# Patient Record
Sex: Female | Born: 1995 | Hispanic: No | Marital: Single | Smoking: Current every day smoker
Health system: Southern US, Community
[De-identification: ages and names within clinical notes are randomized; demographics above are authoritative.]

---

## 2018-06-08 ENCOUNTER — Emergency Department (HOSPITAL_COMMUNITY)
Admission: EM | Admit: 2018-06-08 | Discharge: 2018-06-08 | Disposition: A | Discharge status: Home / Self Care | Payer: Medicaid Other | Attending: Emergency Medicine | Admitting: Emergency Medicine

## 2018-06-08 ENCOUNTER — Other Ambulatory Visit: Payer: Self-pay

## 2018-06-08 ENCOUNTER — Emergency Department (HOSPITAL_COMMUNITY): Payer: Medicaid Other

## 2018-06-08 ENCOUNTER — Encounter (HOSPITAL_COMMUNITY): Payer: Self-pay | Admitting: Emergency Medicine

## 2018-06-08 DIAGNOSIS — R69 Illness, unspecified: Secondary | ICD-10-CM

## 2018-06-08 DIAGNOSIS — R1110 Vomiting, unspecified: Secondary | ICD-10-CM | POA: Diagnosis not present

## 2018-06-08 DIAGNOSIS — J111 Influenza due to unidentified influenza virus with other respiratory manifestations: Secondary | ICD-10-CM | POA: Diagnosis not present

## 2018-06-08 DIAGNOSIS — R52 Pain, unspecified: Secondary | ICD-10-CM | POA: Diagnosis present

## 2018-06-08 DIAGNOSIS — R059 Cough, unspecified: Secondary | ICD-10-CM

## 2018-06-08 DIAGNOSIS — R05 Cough: Secondary | ICD-10-CM

## 2018-06-08 MED ORDER — OSELTAMIVIR PHOSPHATE 75 MG PO CAPS: 75.0000 mg | ORAL_CAPSULE | Freq: Two times a day (BID) | ORAL | 0 refills | Status: AC

## 2018-06-08 MED ORDER — ONDANSETRON 4 MG PO TBDP
4.0000 mg | ORAL_TABLET | Freq: Once | ORAL | Status: AC
  Administered 2018-06-08: 4 mg via ORAL
  Filled 2018-06-08: qty 1

## 2018-06-08 MED ORDER — ALBUTEROL SULFATE HFA 108 (90 BASE) MCG/ACT IN AERS
2.0000 | INHALATION_SPRAY | RESPIRATORY_TRACT | Status: DC | PRN
  Administered 2018-06-08: 2 via RESPIRATORY_TRACT
  Filled 2018-06-08: qty 6.7

## 2018-06-08 MED ORDER — GUAIFENESIN 100 MG/5ML PO SYRP: 100.0000 mg | ORAL_SOLUTION | Freq: Three times a day (TID) | ORAL | 0 refills | Status: AC | PRN

## 2018-06-08 MED ORDER — AEROCHAMBER PLUS FLO-VU LARGE MISC
1.0000 | Freq: Once | Status: AC
  Administered 2018-06-08: 1

## 2018-06-08 MED ORDER — ONDANSETRON 4 MG PO TBDP: ORAL_TABLET | ORAL | 0 refills | Status: AC

## 2018-06-08 NOTE — ED Triage Notes (Signed)
C/o productive cough with white sputum, generalized body aches, pain to center of chest, and vomiting x 2 days.

## 2018-06-08 NOTE — ED Notes (Signed)
Pt seen walking out of ED entrance.  Didn't tell anyone she was leaving.

## 2018-06-08 NOTE — ED Provider Notes (Signed)
[facility] EMERGENCY DEPARTMENT Provider Note   CSN: [id number] Arrival date & time: 06/08/18  0005     History   Chief Complaint Chief Complaint  Patient presents with  . flu like symptoms  . Chest Pain  . Generalized Body Aches  . Cough    HPI Fusako Tanabe is a 23 y.o. female with a hx of no major medical problems presents to the Emergency Department complaining of gradual, persistent, progressively worsening URI symptoms onset 2 days ago after coming with a friend to the emergency department.  Patient reports associated rhinorrhea, nasal congestion, postnasal drip, sore throat, cough, mild headache, subjective fevers.  Patient reports some chest pain only when she coughs.  Patient reports intermittent posttussive emesis but able to eat and drink without difficulty in between.  She denies abdominal pain, diarrhea, weakness, dizziness, syncope.  No treatments prior to arrival.  Patient denies neck stiffness, chest pain, shortness of breath, wheezing.  Patient denies possibility of pregnancy.   The history is provided by the patient and medical records. No language interpreter was used.    History reviewed. No pertinent past medical history.  There are no active problems to display for this patient.   History reviewed. No pertinent surgical history.   OB History   No obstetric history on file.      Home Medications    Prior to Admission medications   Medication Sig Start Date End Date Taking? Authorizing Provider  guaifenesin (ROBITUSSIN) 100 MG/5ML syrup Take 5-10 mLs (100-200 mg total) by mouth 3 (three) times daily as needed for cough. 06/08/18   Denicia Pagliarulo, Dahlia Client, PA-C  ondansetron (ZOFRAN ODT) 4 MG disintegrating tablet 4mg  ODT q4 hours prn nausea/vomit 06/08/18   Achaia Garlock, Dahlia Client, PA-C  oseltamivir (TAMIFLU) 75 MG capsule Take 1 capsule (75 mg total) by mouth every 12 (twelve) hours. 06/08/18   Geraldene Eisel, Dahlia Client, PA-C     Family History No family history on file.  Social History Social History   Tobacco Use  . Smoking status: Current Every Day Smoker  . Smokeless tobacco: Never Used  Substance Use Topics  . Alcohol use: Yes  . Drug use: Not Currently     Allergies   Patient has no allergy information on record.   Review of Systems Review of Systems  Constitutional: Positive for fatigue and fever ( Subjective). Negative for appetite change and chills.  HENT: Positive for congestion, postnasal drip, rhinorrhea, sinus pressure and sore throat. Negative for ear discharge, ear pain and mouth sores.   Eyes: Negative for visual disturbance.  Respiratory: Positive for cough, chest tightness, shortness of breath and wheezing. Negative for stridor.   Cardiovascular: Positive for chest pain. Negative for palpitations and leg swelling.  Gastrointestinal: Positive for vomiting ( Posttussive). Negative for abdominal pain, diarrhea and nausea.  Genitourinary: Negative for dysuria, frequency, hematuria and urgency.  Musculoskeletal: Negative for arthralgias, back pain, myalgias and neck stiffness.  Skin: Negative for rash.  Neurological: Positive for headaches. Negative for syncope, light-headedness and numbness.  Hematological: Negative for adenopathy.  Psychiatric/Behavioral: The patient is not nervous/anxious.   All other systems reviewed and are negative.    Physical Exam Updated Vital Signs BP 105/64   Pulse 84   Temp 98.5 F (36.9 C) (Oral)   Resp 16   Ht 5\' 4"  (1.626 m)   Wt 56.7 kg   LMP 06/04/2018   SpO2 99%   BMI 21.46 kg/m   Physical Exam Vitals signs and nursing note reviewed.  Constitutional:      General: She is not in acute distress.    Appearance: She is well-developed. She is not diaphoretic.  HENT:     Head: Normocephalic and atraumatic.     Right Ear: Tympanic membrane, ear canal and external ear normal.     Left Ear: Tympanic membrane, ear canal and external ear  normal.     Nose: Mucosal edema and rhinorrhea present.     Right Sinus: No maxillary sinus tenderness or frontal sinus tenderness.     Left Sinus: No maxillary sinus tenderness or frontal sinus tenderness.     Mouth/Throat:     Mouth: Mucous membranes are not pale and not cyanotic.     Pharynx: Uvula midline. No oropharyngeal exudate or posterior oropharyngeal erythema.     Tonsils: No tonsillar abscesses.  Eyes:     Conjunctiva/sclera: Conjunctivae normal.     Pupils: Pupils are equal, round, and reactive to light.  Neck:     Musculoskeletal: Full passive range of motion without pain and normal range of motion.  Cardiovascular:     Rate and Rhythm: Normal rate.  Pulmonary:     Effort: Pulmonary effort is normal.     Breath sounds: Normal breath sounds. No stridor.  Abdominal:     Palpations: Abdomen is soft.     Tenderness: There is no abdominal tenderness.     Comments: Soft and nontender  Musculoskeletal: Normal range of motion.  Lymphadenopathy:     Cervical: No cervical adenopathy.  Skin:    General: Skin is warm and dry.     Findings: No rash.  Neurological:     Mental Status: She is alert.      ED Treatments / Results  Labs (all labs ordered are listed, but only abnormal results are displayed) Labs Reviewed  POC URINE PREG, ED    EKG Interpretation  Date/Time:  Monday June 08 2018 00:22:20 EST Ventricular Rate:  89 PR Interval:  130 QRS Duration: 72 QT Interval:  352 QTC Calculation: 428 R Axis:   89 Text Interpretation:  Normal sinus rhythm Normal ECG No old tracing to compare Confirmed by Drema Pry [phone]) on 06/08/2018 5:11:19 AM        Radiology Dg Chest 2 View  Result Date: 06/08/2018 CLINICAL DATA:  23 y/o F; productive cough with white sputum, generalized body aches, pain to the center of the chest, and vomiting for 2 days. EXAM: CHEST - 2 VIEW COMPARISON:  None. FINDINGS: The heart size and mediastinal contours are within normal  limits. Both lungs are clear. The visualized skeletal structures are unremarkable. IMPRESSION: No active cardiopulmonary disease. Electronically Signed   By: Mitzi Hansen M.D.   On: 06/08/2018 02:56    Procedures Procedures (including critical care time)  Medications Ordered in ED Medications  albuterol (PROVENTIL HFA;VENTOLIN HFA) 108 (90 Base) MCG/ACT inhaler 2 puff (2 puffs Inhalation Given 06/08/18 0510)  ondansetron (ZOFRAN-ODT) disintegrating tablet 4 mg (4 mg Oral Given 06/08/18 0509)  AEROCHAMBER PLUS FLO-VU LARGE MISC 1 each (1 each Other Given 06/08/18 2409)     Initial Impression / Assessment and Plan / ED Course  I have reviewed the triage vital signs and the nursing notes.  Pertinent labs & imaging results that were available during my care of the patient were reviewed by me and considered in my medical decision making (see chart for details).  Clinical Course as of Jun 09 555  Mon Jun 08, 2018  7353 Afebrile without tachycardia,  fever or hypotension.  Temp: 98.5 F (36.9 C) [HM]    Clinical Course User Index [HM] Azeem Poorman, Dahlia ClientHannah, PA-C    Patient with symptoms consistent with influenza.  Vitals are stable, low-grade fever.  No signs of dehydration, tolerating PO's.  Lungs are clear.  Chest x-ray without evidence of pneumonia, pneumothorax or pulmonary edema.  Discussed the cost versus benefit of Tamiflu treatment with the patient.  Patient will be discharged with instructions to orally hydrate, rest, and use over-the-counter medications such as anti-inflammatories ibuprofen and Aleve for muscle aches and Tylenol for fever.  Patient will also be given a cough suppressant.    Final Clinical Impressions(s) / ED Diagnoses   Final diagnoses:  Influenza-like illness  Cough  Post-tussive emesis    ED Discharge Orders         Ordered    oseltamivir (TAMIFLU) 75 MG capsule  Every 12 hours     06/08/18 0457    guaifenesin (ROBITUSSIN) 100 MG/5ML syrup  3  times daily PRN     06/08/18 0457    ondansetron (ZOFRAN ODT) 4 MG disintegrating tablet     06/08/18 0458           Micala Saltsman, Dahlia ClientHannah, PA-C 06/08/18 0557    Nira Connardama, Pedro Eduardo, MD 06/08/18 [phone]

## 2018-06-08 NOTE — Discharge Instructions (Signed)
1. Medications: Tamiflu, Robitussin, Zofran, alternate tylenol and ibuprofen for fever control, usual home medications 2. Treatment: rest, drink plenty of fluids,  3. Follow Up: Please followup with your primary doctor in 2 days for discussion of your diagnoses and further evaluation after today's visit; if you do not have a primary care doctor use the resource guide provided to find one; Please return to the ER for syncope, difficulty breathing, persistent high fevers, intractable vomiting or other concerns

## 2018-06-08 NOTE — ED Notes (Signed)
Pt returned to waiting room

## 2019-07-08 ENCOUNTER — Other Ambulatory Visit: Payer: Self-pay

## 2019-07-08 ENCOUNTER — Ambulatory Visit (HOSPITAL_COMMUNITY)
Admission: EM | Admit: 2019-07-08 | Discharge: 2019-07-08 | Disposition: A | Discharge status: Home / Self Care | Payer: Medicaid Other | Attending: Family Medicine | Admitting: Family Medicine

## 2019-07-08 ENCOUNTER — Encounter (HOSPITAL_COMMUNITY): Payer: Self-pay

## 2019-07-08 DIAGNOSIS — M545 Low back pain, unspecified: Secondary | ICD-10-CM

## 2019-07-08 DIAGNOSIS — R1030 Lower abdominal pain, unspecified: Secondary | ICD-10-CM | POA: Diagnosis present

## 2019-07-08 DIAGNOSIS — R102 Pelvic and perineal pain: Secondary | ICD-10-CM | POA: Insufficient documentation

## 2019-07-08 DIAGNOSIS — G8929 Other chronic pain: Secondary | ICD-10-CM | POA: Insufficient documentation

## 2019-07-08 LAB — POCT URINALYSIS DIP (DEVICE)
Glucose, UA: NEGATIVE mg/dL
Leukocytes,Ua: NEGATIVE
Nitrite: NEGATIVE
Protein, ur: 100 mg/dL — AB
Specific Gravity, Urine: 1.025 (ref 1.005–1.030)
Urobilinogen, UA: 1 mg/dL (ref 0.0–1.0)
pH: 6.5 (ref 5.0–8.0)

## 2019-07-08 MED ORDER — IBUPROFEN 800 MG PO TABS: 800.0000 mg | ORAL_TABLET | Freq: Three times a day (TID) | ORAL | 0 refills | Status: AC | PRN

## 2019-07-08 MED ORDER — KETOROLAC TROMETHAMINE 30 MG/ML IJ SOLN
INTRAMUSCULAR | Status: AC
  Filled 2019-07-08: qty 1

## 2019-07-08 MED ORDER — DICYCLOMINE HCL 20 MG PO TABS: 20.0000 mg | ORAL_TABLET | Freq: Two times a day (BID) | ORAL | 0 refills | Status: AC

## 2019-07-08 MED ORDER — KETOROLAC TROMETHAMINE 30 MG/ML IJ SOLN
30.0000 mg | Freq: Once | INTRAMUSCULAR | Status: AC
  Administered 2019-07-08: 30 mg via INTRAMUSCULAR

## 2019-07-08 NOTE — ED Triage Notes (Signed)
Pt state she has pelvis pain . Pt state her mother told her she has a hernia ( 16 yrs ).

## 2019-07-08 NOTE — Discharge Instructions (Addendum)
Your urine was negative today for infection.   We will call you with your swab results.   In the meantime, get in with the [facility], info below.

## 2019-07-08 NOTE — ED Provider Notes (Signed)
[facility]    CSN: [id number] Arrival date & time: 07/08/19  1572      History   Chief Complaint Chief Complaint  Patient presents with  . pelvis pain    HPI Katelyn Young is a 24 y.o. female.   Patient reports pelvic pain that has been on and off for months.  Reports that she is on her period today.  Reports that she is sexually active with women.  Denies vaginal discharge, odor, burning, itchiness.  Denies dysuria, frequency, urgency.  Reports low back pain as well that comes and goes with the pelvic pain.  History significant for possible hernia, reports this was about 15 years ago and was never repaired.  Denies fever, headache, nausea, vomiting, diarrhea, chills, body aches, rash, other symptoms.  ROS Per HPI  The history is provided by the patient.    History reviewed. No pertinent past medical history.  There are no problems to display for this patient.   History reviewed. No pertinent surgical history.  OB History   No obstetric history on file.      Home Medications    Prior to Admission medications   Medication Sig Start Date End Date Taking? Authorizing Provider  dicyclomine (BENTYL) 20 MG tablet Take 1 tablet (20 mg total) by mouth 2 (two) times daily. 07/08/19   Moshe Cipro, NP  guaifenesin (ROBITUSSIN) 100 MG/5ML syrup Take 5-10 mLs (100-200 mg total) by mouth 3 (three) times daily as needed for cough. 06/08/18   Muthersbaugh, Dahlia Client, PA-C  ibuprofen (ADVIL) 800 MG tablet Take 1 tablet (800 mg total) by mouth every 8 (eight) hours as needed for moderate pain. 07/08/19   Moshe Cipro, NP  ondansetron (ZOFRAN ODT) 4 MG disintegrating tablet 4mg  ODT q4 hours prn nausea/vomit 06/08/18   Muthersbaugh, 08/07/18, PA-C  oseltamivir (TAMIFLU) 75 MG capsule Take 1 capsule (75 mg total) by mouth every 12 (twelve) hours. 06/08/18   Muthersbaugh, 08/07/18, PA-C    Family History History reviewed. No pertinent family history.  Social  History Social History   Tobacco Use  . Smoking status: Current Every Day Smoker    Types: Cigars  . Smokeless tobacco: Never Used  Substance Use Topics  . Alcohol use: Yes  . Drug use: Not Currently     Allergies   Patient has no known allergies.   Review of Systems Review of Systems   Physical Exam Triage Vital Signs ED Triage Vitals  Enc Vitals Group     BP 07/08/19 1058 111/65     Pulse Rate 07/08/19 1058 72     Resp 07/08/19 1058 16     Temp 07/08/19 1058 98.8 F (37.1 C)     Temp Source 07/08/19 1058 Oral     SpO2 07/08/19 1058 100 %     Weight 07/08/19 1056 120 lb (54.4 kg)     Height --      Head Circumference --      Peak Flow --      Pain Score --      Pain Loc --      Pain Edu? --      Excl. in GC? --    No data found.  Updated Vital Signs BP 111/65 (BP Location: Right Arm)   Pulse 72   Temp 98.8 F (37.1 C) (Oral)   Resp 16   Wt 120 lb (54.4 kg)   LMP 07/08/2019   SpO2 100%   BMI 20.60 kg/m  Physical Exam Vitals and nursing note reviewed.  Constitutional:      General: She is not in acute distress.    Appearance: Normal appearance. She is well-developed and normal weight. She is ill-appearing.  HENT:     Head: Normocephalic and atraumatic.     Right Ear: Tympanic membrane normal.     Left Ear: Tympanic membrane normal.     Nose: Nose normal.     Mouth/Throat:     Mouth: Mucous membranes are moist.     Pharynx: Oropharynx is clear.  Eyes:     Conjunctiva/sclera: Conjunctivae normal.  Cardiovascular:     Rate and Rhythm: Normal rate and regular rhythm.     Heart sounds: No murmur.  Pulmonary:     Effort: Pulmonary effort is normal. No respiratory distress.     Breath sounds: Normal breath sounds.  Abdominal:     General: Bowel sounds are normal. There is no distension.     Palpations: Abdomen is soft. There is no mass.     Tenderness: There is generalized abdominal tenderness. There is no right CVA tenderness, left CVA  tenderness, guarding or rebound.     Hernia: No hernia is present.     Comments: Generalized abdominal tenderness, noted increased tenderness in suprapubic area.  Musculoskeletal:        General: Normal range of motion.     Cervical back: Neck supple.  Skin:    General: Skin is warm and dry.     Capillary Refill: Capillary refill takes less than 2 seconds.  Neurological:     General: No focal deficit present.     Mental Status: She is alert and oriented to person, place, and time.  Psychiatric:        Mood and Affect: Mood normal.        Behavior: Behavior normal.      UC Treatments / Results  Labs (all labs ordered are listed, but only abnormal results are displayed) Labs Reviewed  POCT URINALYSIS DIP (DEVICE) - Abnormal; Notable for the following components:      Result Value   Bilirubin Urine SMALL (*)    Ketones, ur TRACE (*)    Hgb urine dipstick LARGE (*)    Protein, ur 100 (*)    All other components within normal limits  CERVICOVAGINAL ANCILLARY ONLY    EKG   Radiology No results found.  Procedures Procedures (including critical care time)  Medications Ordered in UC Medications  ketorolac (TORADOL) 30 MG/ML injection 30 mg (30 mg Intramuscular Given 07/08/19 1227)    Initial Impression / Assessment and Plan / UC Course  I have reviewed the triage vital signs and the nursing notes.  Pertinent labs & imaging results that were available during my care of the patient were reviewed by me and considered in my medical decision making (see chart for details).     Presents for lower abdominal pain, pelvic pain, bilateral back pain without sciatica.  UA in office negative for infection.  Swab obtained to test for STDs.  Will inform patient when these tests are back, and treat accordingly.  Ibuprofen 800 every 8 hours as needed for pain.  Dicyclomine 4 mg sent in as well, may take every 4 hours as needed for abdominal pain and cramping.  GI versus reproductive issue.   Information given for [place] [facility], patient to follow-up there if symptoms are not improving.  Instructed to go to the ER for shortness of breath, fever, heavy  vaginal bleeding, other concerning symptoms. Final Clinical Impressions(s) / UC Diagnoses   Final diagnoses:  Pelvic pain  Lower abdominal pain  Chronic bilateral low back pain without sciatica     Discharge Instructions     Your urine was negative today for infection.   We will call you with your swab results.   In the meantime, get in with the [facility], info below.       ED Prescriptions    Medication Sig Dispense Auth. Provider   dicyclomine (BENTYL) 20 MG tablet Take 1 tablet (20 mg total) by mouth 2 (two) times daily. 20 tablet Faustino Congress, NP   ibuprofen (ADVIL) 800 MG tablet Take 1 tablet (800 mg total) by mouth every 8 (eight) hours as needed for moderate pain. 21 tablet Faustino Congress, NP     PDMP not reviewed this encounter.   Faustino Congress, NP 07/09/19 1115

## 2019-07-10 LAB — CERVICOVAGINAL ANCILLARY ONLY
Bacterial vaginitis: POSITIVE — AB
Candida vaginitis: NEGATIVE
Chlamydia: NEGATIVE
Neisseria Gonorrhea: NEGATIVE
Trichomonas: NEGATIVE

## 2020-02-01 IMAGING — DX DG CHEST 2V
2 series · 2 of 2 positions shown · non-contrast
Comparison: None.

CLINICAL DATA: 22 y/o F; productive cough with white sputum,
generalized body aches, pain to the center of the chest, and
vomiting for 2 days.

EXAM:
CHEST - 2 VIEW

[chest pa]
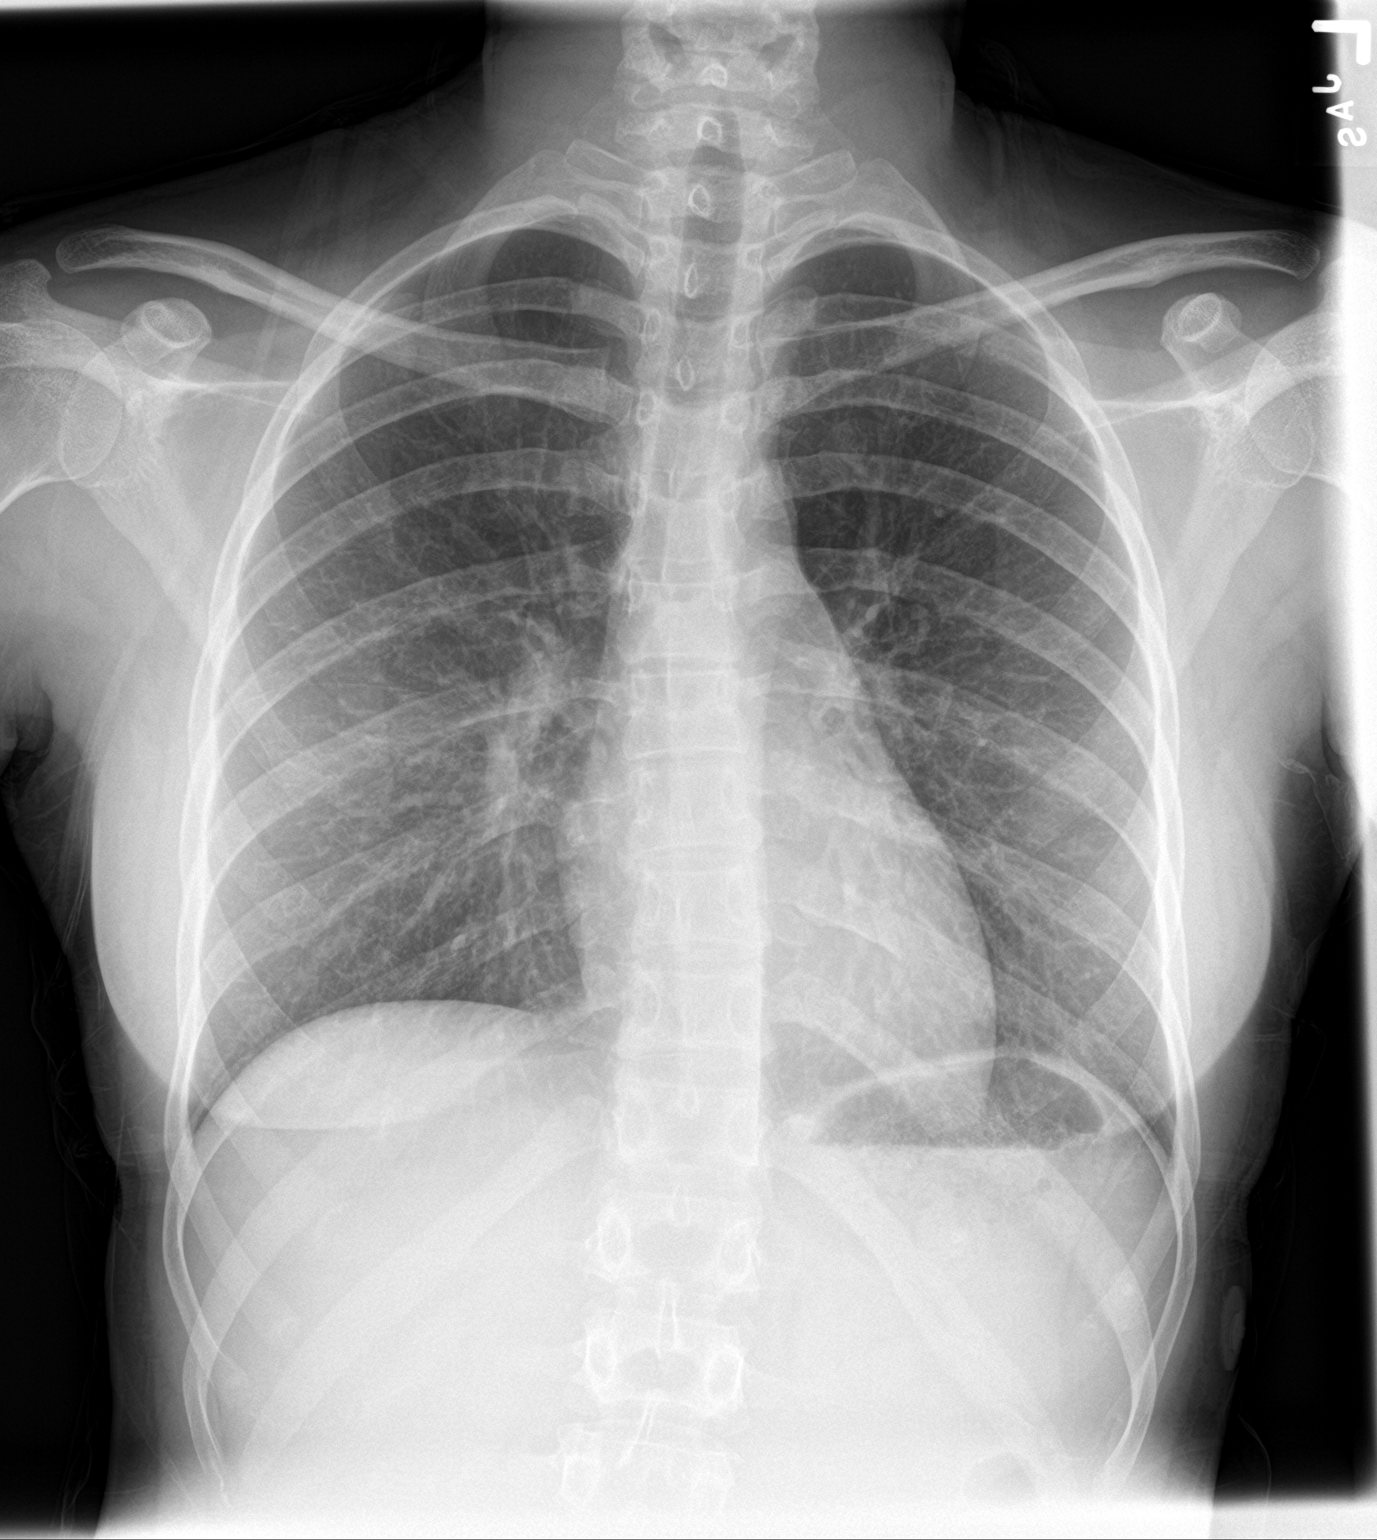

[chest lat]
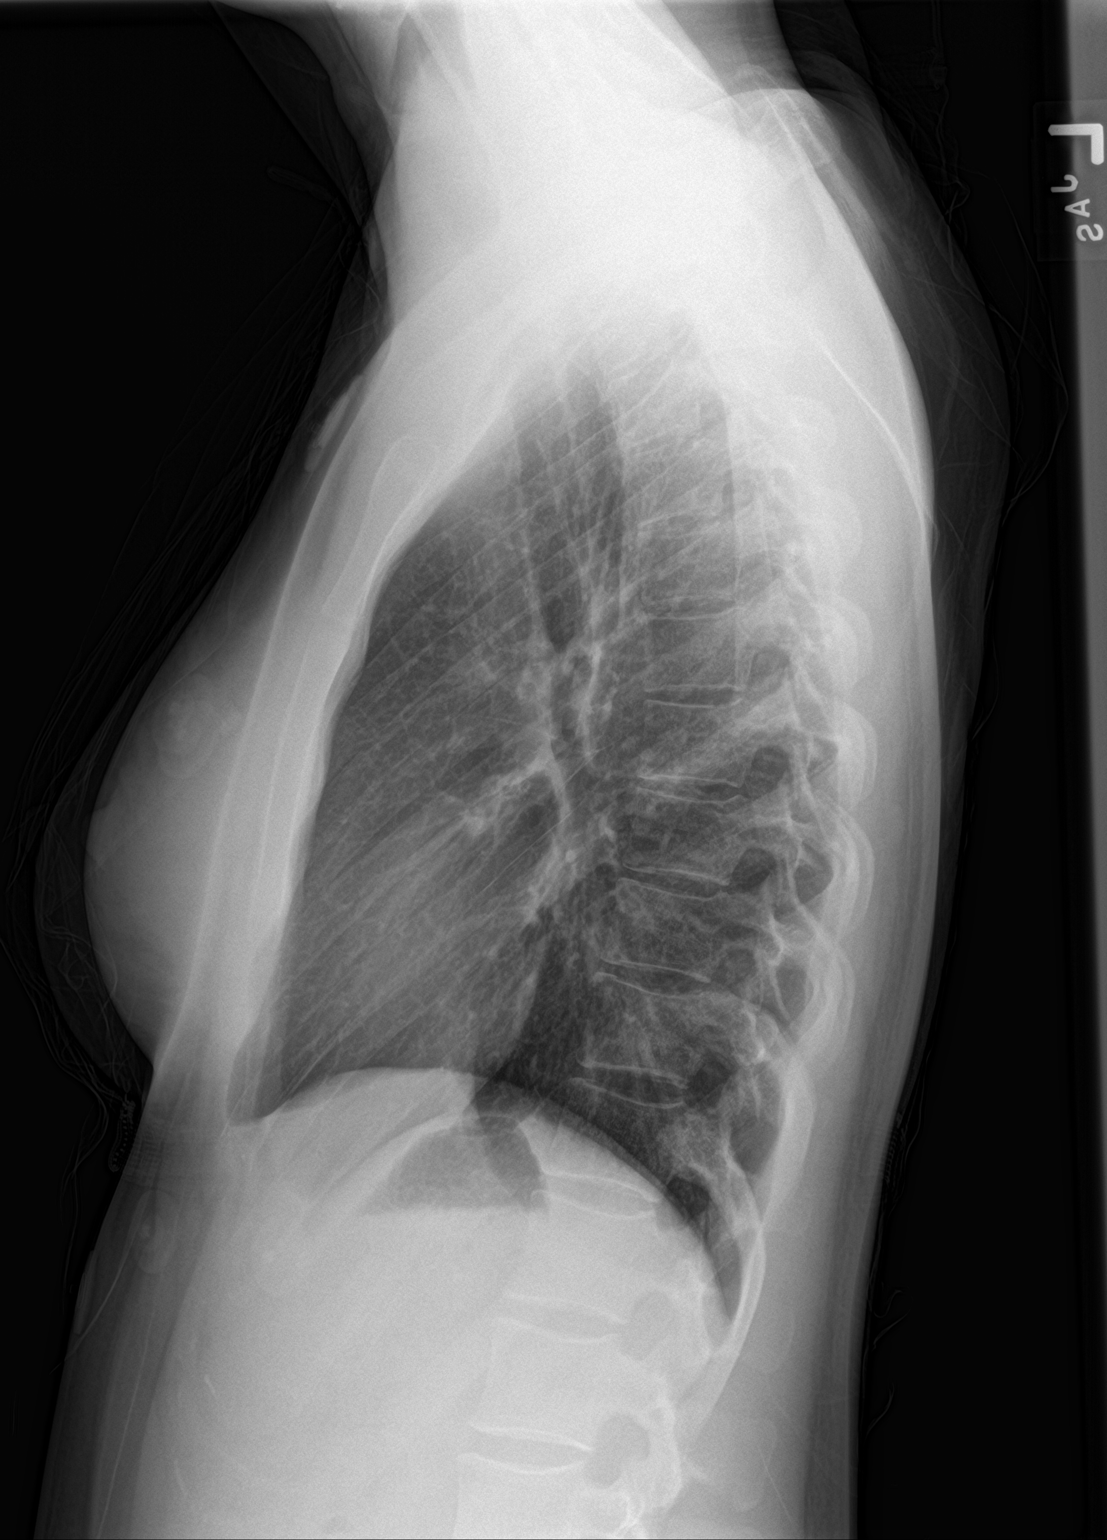

[2 of 2 positions shown; findings below may reference images not displayed]

FINDINGS: The heart size and mediastinal contours are within normal limits.
Both lungs are clear. The visualized skeletal structures are
unremarkable.
IMPRESSION: No active cardiopulmonary disease.
